# Patient Record
Sex: Female | Born: 1990 | Race: White | Hispanic: No | Marital: Single | Smoking: Never smoker
Health system: Southern US, Community
[De-identification: ages and names within clinical notes are randomized; demographics above are authoritative.]

## PROBLEM LIST (undated history)

## (undated) DIAGNOSIS — Q213 Tetralogy of Fallot: Secondary | ICD-10-CM

## (undated) HISTORY — PX: PULMONIC VALVE REPLACEMENT: SHX754

---
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
## 2014-11-10 ENCOUNTER — Emergency Department (HOSPITAL_BASED_OUTPATIENT_CLINIC_OR_DEPARTMENT_OTHER): Payer: Medicaid Other

## 2014-11-10 ENCOUNTER — Observation Stay (HOSPITAL_BASED_OUTPATIENT_CLINIC_OR_DEPARTMENT_OTHER)
Admission: EM | Admit: 2014-11-10 | Discharge: 2014-11-12 | Disposition: A | Discharge status: Home / Self Care | Payer: Medicaid Other | Attending: Cardiovascular Disease | Admitting: Cardiovascular Disease

## 2014-11-10 ENCOUNTER — Encounter (HOSPITAL_BASED_OUTPATIENT_CLINIC_OR_DEPARTMENT_OTHER): Payer: Self-pay | Admitting: Emergency Medicine

## 2014-11-10 DIAGNOSIS — Z955 Presence of coronary angioplasty implant and graft: Secondary | ICD-10-CM | POA: Diagnosis not present

## 2014-11-10 DIAGNOSIS — I372 Nonrheumatic pulmonary valve stenosis with insufficiency: Secondary | ICD-10-CM | POA: Insufficient documentation

## 2014-11-10 DIAGNOSIS — I4510 Unspecified right bundle-branch block: Secondary | ICD-10-CM | POA: Insufficient documentation

## 2014-11-10 DIAGNOSIS — R002 Palpitations: Secondary | ICD-10-CM | POA: Diagnosis present

## 2014-11-10 DIAGNOSIS — I493 Ventricular premature depolarization: Principal | ICD-10-CM | POA: Insufficient documentation

## 2014-11-10 DIAGNOSIS — I5020 Unspecified systolic (congestive) heart failure: Secondary | ICD-10-CM | POA: Diagnosis not present

## 2014-11-10 DIAGNOSIS — Z953 Presence of xenogenic heart valve: Secondary | ICD-10-CM | POA: Insufficient documentation

## 2014-11-10 DIAGNOSIS — I272 Other secondary pulmonary hypertension: Secondary | ICD-10-CM | POA: Diagnosis not present

## 2014-11-10 HISTORY — DX: Tetralogy of Fallot: Q21.3

## 2014-11-10 LAB — CBC WITH DIFFERENTIAL/PLATELET
Basophils Absolute: 0 10*3/uL (ref 0.0–0.1)
Basophils Relative: 0 % (ref 0–1)
EOS ABS: 0.1 10*3/uL (ref 0.0–0.7)
Eosinophils Relative: 1 % (ref 0–5)
HCT: 36.6 % (ref 36.0–46.0)
Hemoglobin: 12.3 g/dL (ref 12.0–15.0)
LYMPHS ABS: 1.7 10*3/uL (ref 0.7–4.0)
Lymphocytes Relative: 20 % (ref 12–46)
MCH: 26.6 pg (ref 26.0–34.0)
MCHC: 33.6 g/dL (ref 30.0–36.0)
MCV: 79 fL (ref 78.0–100.0)
Monocytes Absolute: 0.8 10*3/uL (ref 0.1–1.0)
Monocytes Relative: 10 % (ref 3–12)
NEUTROS ABS: 5.7 10*3/uL (ref 1.7–7.7)
Neutrophils Relative %: 69 % (ref 43–77)
Platelets: 281 10*3/uL (ref 150–400)
RBC: 4.63 MIL/uL (ref 3.87–5.11)
RDW: 17 % — AB (ref 11.5–15.5)
WBC: 8.3 10*3/uL (ref 4.0–10.5)

## 2014-11-10 LAB — CREATININE, SERUM
Creatinine, Ser: 0.99 mg/dL (ref 0.44–1.00)
GFR calc Af Amer: >60 mL/min (ref >60)
GFR calc non Af Amer: >60 mL/min (ref >60)

## 2014-11-10 LAB — BASIC METABOLIC PANEL
ANION GAP: 8 (ref 5–15)
BUN: 9 mg/dL (ref 6–20)
CALCIUM: 8.6 mg/dL — AB (ref 8.9–10.3)
CO2: 25 mmol/L (ref 22–32)
CREATININE: 0.91 mg/dL (ref 0.44–1.00)
Chloride: 104 mmol/L (ref 101–111)
GLUCOSE: 87 mg/dL (ref 65–99)
Potassium: 3.6 mmol/L (ref 3.5–5.1)
Sodium: 137 mmol/L (ref 135–145)

## 2014-11-10 LAB — CBC
HCT: 34.4 % — ABNORMAL LOW (ref 36.0–46.0)
HEMOGLOBIN: 11.7 g/dL — AB (ref 12.0–15.0)
MCH: 26.3 pg (ref 26.0–34.0)
MCHC: 34 g/dL (ref 30.0–36.0)
MCV: 77.3 fL — ABNORMAL LOW (ref 78.0–100.0)
Platelets: 281 10*3/uL (ref 150–400)
RBC: 4.45 MIL/uL (ref 3.87–5.11)
RDW: 16.9 % — ABNORMAL HIGH (ref 11.5–15.5)
WBC: 10.1 10*3/uL (ref 4.0–10.5)

## 2014-11-10 LAB — TROPONIN I: Troponin I: <0.03 ng/mL (ref <0.031)

## 2014-11-10 LAB — D-DIMER, QUANTITATIVE (NOT AT ARMC): D DIMER QUANT: 0.86 ug{FEU}/mL — AB (ref 0.00–0.48)

## 2014-11-10 LAB — BRAIN NATRIURETIC PEPTIDE: B NATRIURETIC PEPTIDE 5: 86.2 pg/mL (ref 0.0–100.0)

## 2014-11-10 MED ORDER — SODIUM CHLORIDE 0.9 % IV SOLN: 250.0000 mL | INTRAVENOUS | Status: DC | PRN

## 2014-11-10 MED ORDER — IOHEXOL 350 MG/ML SOLN
100.0000 mL | Freq: Once | INTRAVENOUS | Status: AC | PRN
  Administered 2014-11-10: 100 mL via INTRAVENOUS

## 2014-11-10 MED ORDER — ALUM & MAG HYDROXIDE-SIMETH 200-200-20 MG/5ML PO SUSP: 30.0000 mL | Freq: Four times a day (QID) | ORAL | Status: DC | PRN

## 2014-11-10 MED ORDER — SODIUM CHLORIDE 0.9 % IJ SOLN
3.0000 mL | Freq: Two times a day (BID) | INTRAMUSCULAR | Status: DC
  Administered 2014-11-10 – 2014-11-12 (×3): 3 mL via INTRAVENOUS

## 2014-11-10 MED ORDER — ACETAMINOPHEN 650 MG RE SUPP: 650.0000 mg | Freq: Four times a day (QID) | RECTAL | Status: DC | PRN

## 2014-11-10 MED ORDER — ONDANSETRON HCL 4 MG/2ML IJ SOLN: 4.0000 mg | Freq: Four times a day (QID) | INTRAMUSCULAR | Status: DC | PRN

## 2014-11-10 MED ORDER — ACETAMINOPHEN 325 MG PO TABS: 650.0000 mg | ORAL_TABLET | Freq: Four times a day (QID) | ORAL | Status: DC | PRN

## 2014-11-10 MED ORDER — SODIUM CHLORIDE 0.9 % IJ SOLN
3.0000 mL | INTRAMUSCULAR | Status: DC | PRN
  Administered 2014-11-11 (×2): 3 mL via INTRAVENOUS
  Filled 2014-11-10 (×2): qty 3

## 2014-11-10 MED ORDER — HEPARIN SODIUM (PORCINE) 5000 UNIT/ML IJ SOLN
5000.0000 [IU] | Freq: Three times a day (TID) | INTRAMUSCULAR | Status: DC
  Filled 2014-11-10 (×6): qty 1

## 2014-11-10 MED ORDER — ONDANSETRON HCL 4 MG PO TABS: 4.0000 mg | ORAL_TABLET | Freq: Four times a day (QID) | ORAL | Status: DC | PRN

## 2014-11-10 NOTE — ED Notes (Signed)
Pt in c/o palpitations and irregular beats. States open heart surgery in 2015 with valve replacements and stent placement. Pt in NAD at this time.

## 2014-11-10 NOTE — ED Notes (Signed)
On ambulation in hallway, pt sat dropped to 80%, heart rate remained 118 throughout. At periods of rest, sat rose to 90's, but fell back to 80% after reinitiating ambulation. Pt denied chest pain, SOB, dizziness, or lightheadedness throughout the walk.

## 2014-11-10 NOTE — ED Notes (Signed)
Denies recent illness and pt denies chest pain, fever, SOB or syncope.

## 2014-11-10 NOTE — ED Provider Notes (Signed)
CSN: [id number]     Arrival date & time 11/10/14  1132 History   First MD Initiated Contact with Patient 11/10/14 1150     Chief Complaint  Patient presents with  . Palpitations     (Consider location/radiation/quality/duration/timing/severity/associated sxs/prior Treatment) HPI Comments: Patient presents with palpitations. She has a history of tetralogy of flow. She had surgery for that when she was a child in [place]. She also had an aortic valve replacement with a bovine valve in 2015. This was done in [place]. She recently moved here to  [facility]. She doesn't currently have a cardiologist. She states over the last 3 days she's had some palpitations. She notices it more when she's getting up to ambulate. She denies any shortness of breath. She denies any chest pain or tightness. She denies any leg swelling. She denies any new medications. She denies any fevers chills or other recent illnesses. She denies any dizziness or lightheadedness. She denies any leg swelling or calf tenderness. She denies any recent travel history. She's a nonsmoker.  Patient is a 24 y.o. female presenting with palpitations.  Palpitations Associated symptoms: no back pain, no chest pain, no cough, no diaphoresis, no dizziness, no nausea, no numbness, no shortness of breath, no vomiting and no weakness     Past Medical History  Diagnosis Date  . Tetralogy of Fallot    Past Surgical History  Procedure Laterality Date  . Cardiac surgery    . Coronary stent placement    . Valve replacement     History reviewed. No pertinent family history. History  Substance Use Topics  . Smoking status: Never Smoker   . Smokeless tobacco: Not on file  . Alcohol Use: No   OB History    No data available     Review of Systems  Constitutional: Negative for fever, chills, diaphoresis and fatigue.  HENT: Negative for congestion, rhinorrhea and sneezing.   Eyes: Negative.   Respiratory: Negative for cough, chest  tightness and shortness of breath.   Cardiovascular: Positive for palpitations. Negative for chest pain and leg swelling.  Gastrointestinal: Negative for nausea, vomiting, abdominal pain, diarrhea and blood in stool.  Genitourinary: Negative for frequency, hematuria, flank pain and difficulty urinating.  Musculoskeletal: Negative for back pain and arthralgias.  Skin: Negative for rash.  Neurological: Negative for dizziness, speech difficulty, weakness, numbness and headaches.      Allergies  Review of patient's allergies indicates no known allergies.  Home Medications   Prior to Admission medications   Not on File   BP 122/85 mmHg  Pulse 85  Temp(Src) 98.3 F (36.8 C) (Oral)  Resp 18  Wt 140 lb (63.504 kg)  SpO2 100%  LMP 10/27/2014 Physical Exam  Constitutional: She is oriented to person, place, and time. She appears well-developed and well-nourished.  HENT:  Head: Normocephalic and atraumatic.  Eyes: Pupils are equal, round, and reactive to light.  Neck: Normal range of motion. Neck supple.  Cardiovascular: Normal rate and regular rhythm.   Murmur heard. Pulmonary/Chest: Effort normal and breath sounds normal. No respiratory distress. She has no wheezes. She has no rales. She exhibits no tenderness.  Abdominal: Soft. Bowel sounds are normal. There is no tenderness. There is no rebound and no guarding.  Musculoskeletal: Normal range of motion. She exhibits no edema.  No edema or calf tenderness  Lymphadenopathy:    She has no cervical adenopathy.  Neurological: She is alert and oriented to person, place, and time.  Skin: Skin is  warm and dry. No rash noted.  Psychiatric: She has a normal mood and affect.    ED Course  Procedures (including critical care time) Labs Review Results for orders placed or performed during the hospital encounter of 11/10/14  Basic metabolic panel  Result Value Ref Range   Sodium 137 135 - 145 mmol/L   Potassium 3.6 3.5 - 5.1 mmol/L    Chloride 104 101 - 111 mmol/L   CO2 25 22 - 32 mmol/L   Glucose, Bld 87 65 - 99 mg/dL   BUN 9 6 - 20 mg/dL   Creatinine, Ser 1.61 0.44 - 1.00 mg/dL   Calcium 8.6 (L) 8.9 - 10.3 mg/dL   GFR calc non Af Amer >60 >60 mL/min   GFR calc Af Amer >60 >60 mL/min   Anion gap 8 5 - 15  CBC with Differential  Result Value Ref Range   WBC 8.3 4.0 - 10.5 K/uL   RBC 4.63 3.87 - 5.11 MIL/uL   Hemoglobin 12.3 12.0 - 15.0 g/dL   HCT 09.6 04.5 - 40.9 %   MCV 79.0 78.0 - 100.0 fL   MCH 26.6 26.0 - 34.0 pg   MCHC 33.6 30.0 - 36.0 g/dL   RDW 81.1 (H) 91.4 - 78.2 %   Platelets 281 150 - 400 K/uL   Neutrophils Relative % 69 43 - 77 %   Neutro Abs 5.7 1.7 - 7.7 K/uL   Lymphocytes Relative 20 12 - 46 %   Lymphs Abs 1.7 0.7 - 4.0 K/uL   Monocytes Relative 10 3 - 12 %   Monocytes Absolute 0.8 0.1 - 1.0 K/uL   Eosinophils Relative 1 0 - 5 %   Eosinophils Absolute 0.1 0.0 - 0.7 K/uL   Basophils Relative 0 0 - 1 %   Basophils Absolute 0.0 0.0 - 0.1 K/uL  Troponin I  Result Value Ref Range   Troponin I <0.03 <0.031 ng/mL  Brain natriuretic peptide  Result Value Ref Range   B Natriuretic Peptide 86.2 0.0 - 100.0 pg/mL  D-dimer, quantitative  Result Value Ref Range   D-Dimer, Quant 0.86 (H) 0.00 - 0.48 ug/mL-FEU   Dg Chest 2 View  11/10/2014   CLINICAL DATA:  Patient with treated tetralogy of Fallot presenting with three-day history of heart palpitations and shortness of breath  EXAM: CHEST  2 VIEW  COMPARISON:  None.  FINDINGS: The patient has a prosthetic pulmonic valve as well as a stent between the main pulmonary outflow tract and aorta. The heart size is upper normal. The pulmonary vascularity is normal. The aortic arch is on the left side.  There is no lung edema or consolidation. No adenopathy. There is upper thoracic levoscoliosis.  IMPRESSION: Postoperative change with prosthetic pulmonic valve and stent between the main pulmonary outflow tract and aorta. No edema or consolidation. Heart upper  normal in size with pulmonary vascularity within normal limits.   Electronically Signed   By: Bretta Bang III M.D.   On: 11/10/2014 12:48      Imaging Review Dg Chest 2 View  11/10/2014   CLINICAL DATA:  Patient with treated tetralogy of Fallot presenting with three-day history of heart palpitations and shortness of breath  EXAM: CHEST  2 VIEW  COMPARISON:  None.  FINDINGS: The patient has a prosthetic pulmonic valve as well as a stent between the main pulmonary outflow tract and aorta. The heart size is upper normal. The pulmonary vascularity is normal. The aortic arch is on the  left side.  There is no lung edema or consolidation. No adenopathy. There is upper thoracic levoscoliosis.  IMPRESSION: Postoperative change with prosthetic pulmonic valve and stent between the main pulmonary outflow tract and aorta. No edema or consolidation. Heart upper normal in size with pulmonary vascularity within normal limits.   Electronically Signed   By: Bretta BangWilliam  Woodruff III M.D.   On: 11/10/2014 12:48     EKG Interpretation   Date/Time:  Thursday November 10 2014 11:44:07 EDT Ventricular Rate:  115 PR Interval:  142 QRS Duration: 138 QT Interval:  370 QTC Calculation: 511 R Axis:   -11 Text Interpretation:  Sinus tachycardia Right bundle branch block Abnormal  ECG No old tracing to compare Confirmed by Jakobi Thetford  MD, Ashlynne Shetterly ([id number]) on  11/10/2014 1:58:37 PM      MDM   Final diagnoses:  None    Pt with complicated cardiac hx of Tet of Fallot s/p repair and more recent Aortic valve replacement.  Pt has RBBB on EKG and murmur.  Suspect this is chronic for pt, but pt doesn't know about murmur and we were unable to obtain records from her hospitals, [facility] in [place].  The hospital states that they have no records for her other than an ED visit from 2011, including [facility].  PT states this is where she had her heart surgery and her wallet card supports this.    Pt is asymptomatic  other than palpitations.  No evidence of heart failure.  On ambulating, sats dropped to 80s and HR increased to 120s.  However patient was not short of breath. Given this, I did check a d-dimer which was elevated. Will check CT angio of her chest.  No evidence of PE.  Spoke with Dr. Algie CofferKadakia who [facility].  Rolan BuccoMelanie Brynnlie Unterreiner, MD 11/10/14 [phone]

## 2014-11-10 NOTE — H&P (Signed)
Referring Physician:  Kalii Chesmore is an 24 y.o. female.                       Chief Complaint: Palpitation  HPI: 24 year old female with Tetralogy of Fallot with prosthetic PV and stent in pulmonary artery when she was a child in [place], has palpitations x 3 days. She had hypoxemia with ambulation at Med center, [facility]. She denies any shortness of breath, chest pain or tightness or any leg swelling. She denies any new medications. She denies any fevers chills or other recent illnesses. She denies any dizziness or lightheadedness. She denies any leg swelling or calf tenderness. She denies any recent travel history. She's a nonsmoker.  Past Medical History  Diagnosis Date  . Tetralogy of Fallot       Past Surgical History  Procedure Laterality Date  . Cardiac surgery    . Coronary stent placement    . Valve replacement      History reviewed. No pertinent family history. Social History:  reports that she has never smoked. She does not have any smokeless tobacco history on file. She reports that she does not drink alcohol or use illicit drugs.  Allergies: No Known Allergies  No prescriptions prior to admission    Results for orders placed or performed during the hospital encounter of 11/10/14 (from the past 48 hour(s))  Brain natriuretic peptide     Status: None   Collection Time: 11/10/14 12:10 PM  Result Value Ref Range   B Natriuretic Peptide 86.2 0.0 - 100.0 pg/mL  Basic metabolic panel     Status: Abnormal   Collection Time: 11/10/14 12:15 PM  Result Value Ref Range   Sodium 137 135 - 145 mmol/L   Potassium 3.6 3.5 - 5.1 mmol/L   Chloride 104 101 - 111 mmol/L   CO2 25 22 - 32 mmol/L   Glucose, Bld 87 65 - 99 mg/dL   BUN 9 6 - 20 mg/dL   Creatinine, Ser 0.91 0.44 - 1.00 mg/dL   Calcium 8.6 (L) 8.9 - 10.3 mg/dL   GFR calc non Af Amer >60 >60 mL/min   GFR calc Af Amer >60 >60 mL/min    Comment: (NOTE) The eGFR has been calculated using the CKD EPI  equation. This calculation has not been validated in all clinical situations. eGFR's persistently <60 mL/min signify possible Chronic Kidney Disease.    Anion gap 8 5 - 15  CBC with Differential     Status: Abnormal   Collection Time: 11/10/14 12:15 PM  Result Value Ref Range   WBC 8.3 4.0 - 10.5 K/uL   RBC 4.63 3.87 - 5.11 MIL/uL   Hemoglobin 12.3 12.0 - 15.0 g/dL   HCT 36.6 36.0 - 46.0 %   MCV 79.0 78.0 - 100.0 fL   MCH 26.6 26.0 - 34.0 pg   MCHC 33.6 30.0 - 36.0 g/dL   RDW 17.0 (H) 11.5 - 15.5 %   Platelets 281 150 - 400 K/uL   Neutrophils Relative % 69 43 - 77 %   Neutro Abs 5.7 1.7 - 7.7 K/uL   Lymphocytes Relative 20 12 - 46 %   Lymphs Abs 1.7 0.7 - 4.0 K/uL   Monocytes Relative 10 3 - 12 %   Monocytes Absolute 0.8 0.1 - 1.0 K/uL   Eosinophils Relative 1 0 - 5 %   Eosinophils Absolute 0.1 0.0 - 0.7 K/uL   Basophils Relative 0 0 -  1 %   Basophils Absolute 0.0 0.0 - 0.1 K/uL  Troponin I     Status: None   Collection Time: 11/10/14 12:15 PM  Result Value Ref Range   Troponin I <0.03 <0.031 ng/mL    Comment:        NO INDICATION OF MYOCARDIAL INJURY.   D-dimer, quantitative     Status: Abnormal   Collection Time: 11/10/14 12:15 PM  Result Value Ref Range   D-Dimer, Quant 0.86 (H) 0.00 - 0.48 ug/mL-FEU    Comment:        AT THE INHOUSE ESTABLISHED CUTOFF VALUE OF 0.48 ug/mL FEU, THIS ASSAY HAS BEEN DOCUMENTED IN THE LITERATURE TO HAVE A SENSITIVITY AND NEGATIVE PREDICTIVE VALUE OF AT LEAST 98 TO 99%.  THE TEST RESULT SHOULD BE CORRELATED WITH AN ASSESSMENT OF THE CLINICAL PROBABILITY OF DVT / VTE.    Dg Chest 2 View  11/10/2014   CLINICAL DATA:  Patient with treated tetralogy of Fallot presenting with three-day history of heart palpitations and shortness of breath  EXAM: CHEST  2 VIEW  COMPARISON:  None.  FINDINGS: The patient has a prosthetic pulmonic valve as well as a stent between the main pulmonary outflow tract and aorta. The heart size is upper normal.  The pulmonary vascularity is normal. The aortic arch is on the left side.  There is no lung edema or consolidation. No adenopathy. There is upper thoracic levoscoliosis.  IMPRESSION: Postoperative change with prosthetic pulmonic valve and stent between the main pulmonary outflow tract and aorta. No edema or consolidation. Heart upper normal in size with pulmonary vascularity within normal limits.   Electronically Signed   By: Lowella Grip III M.D.   On: 11/10/2014 12:48   Ct Angio Chest Pe W/cm &/or Wo Cm  11/10/2014   CLINICAL DATA:  Shortness of breath.  EXAM: CT ANGIOGRAPHY CHEST WITH CONTRAST  TECHNIQUE: Multidetector CT imaging of the chest was performed using the standard protocol during bolus administration of intravenous contrast. Multiplanar CT image reconstructions and MIPs were obtained to evaluate the vascular anatomy.  CONTRAST:  167m OMNIPAQUE IOHEXOL 350 MG/ML SOLN  COMPARISON:  Chest x-ray dated 11/10/2014  FINDINGS: There is cardiomegaly. The patient has a prosthetic pulmonary valve and a stent in left main pulmonary artery. There is also slight prominence of the ascending thoracic aorta to a diameter of 3.2 cm.  There are no pulmonary emboli, infiltrates, or effusions. No acute osseous abnormality. Slight thoracic scoliosis. The visualized portion of the upper abdomen is normal.  Review of the MIP images confirms the above findings.  IMPRESSION: 1. No pulmonary emboli or other acute abnormalities. 2. There is what I suspect is chronic cardiomegaly with prosthetic mitral valve and left main pulmonary artery stent.   Electronically Signed   By: JLorriane ShireM.D.   On: 11/10/2014 15:21    Review Of Systems Constitutional: Negative for fever, chills, diaphoresis and fatigue.  HENT: Negative for congestion, rhinorrhea and sneezing.  Eyes: Negative.  Respiratory: Negative for cough, chest tightness and shortness of breath.  Cardiovascular: Positive for palpitations. Negative for  chest pain and leg swelling.  Gastrointestinal: Negative for nausea, vomiting, abdominal pain, diarrhea and blood in stool.  Genitourinary: Negative for frequency, hematuria, flank pain and difficulty urinating.  Musculoskeletal: Negative for back pain and arthralgias.  Skin: Negative for rash.  Neurological: Negative for dizziness, speech difficulty, weakness, numbness and headaches.  Blood pressure 110/80, pulse 111, temperature 98.3 F (36.8 C), temperature source Oral, resp.  rate 22, weight 63.504 kg (140 lb), last menstrual period 10/27/2014, SpO2 100 %. Physical Exam  Constitutional: She appears well-developed and well-nourished.  HENT: Normocephalic and atraumatic, Brown eyes, Pupils are equal, round, and reactive to light. EOMI. Conj-pale pink, Sclera-white. Neck: Normal range of motion. Neck supple.  Cardiovascular: Normal rate and regular rhythm.IV/VI systolic murmur heard with fixed splitting of S2. II/VI diastolic murmur. Pulmonary/Chest: Effort normal and breath sounds normal. No respiratory distress. She has no wheezes. She has no rales. She exhibits no tenderness.  Abdominal: Soft. Bowel sounds are normal. There is no tenderness. There is no rebound and no guarding.  Musculoskeletal: Normal range of motion. She exhibits no edema. No edema or calf tenderness. Lymphadenopathy: She has no cervical adenopathy.  Neurological: She is alert and oriented to person, place, and time. Moves all four extremities. Skin: Skin is warm and dry. No rash noted.  Psychiatric: She has a normal mood and affect.   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  Birdie Riddle,  MD  11/10/2014, 5:36 PM

## 2014-11-10 NOTE — ED Notes (Signed)
Patient transported to X-ray 

## 2014-11-11 ENCOUNTER — Observation Stay (HOSPITAL_COMMUNITY): Payer: Medicaid Other

## 2014-11-11 DIAGNOSIS — I493 Ventricular premature depolarization: Secondary | ICD-10-CM | POA: Diagnosis not present

## 2014-11-11 DIAGNOSIS — Z953 Presence of xenogenic heart valve: Secondary | ICD-10-CM | POA: Diagnosis not present

## 2014-11-11 DIAGNOSIS — I4510 Unspecified right bundle-branch block: Secondary | ICD-10-CM | POA: Diagnosis not present

## 2014-11-11 DIAGNOSIS — Z955 Presence of coronary angioplasty implant and graft: Secondary | ICD-10-CM | POA: Diagnosis not present

## 2014-11-11 LAB — BASIC METABOLIC PANEL
ANION GAP: 10 (ref 5–15)
BUN: 8 mg/dL (ref 6–20)
CHLORIDE: 105 mmol/L (ref 101–111)
CO2: 23 mmol/L (ref 22–32)
CREATININE: 0.94 mg/dL (ref 0.44–1.00)
Calcium: 8.7 mg/dL — ABNORMAL LOW (ref 8.9–10.3)
GFR calc Af Amer: >60 mL/min (ref >60)
GFR calc non Af Amer: >60 mL/min (ref >60)
GLUCOSE: 81 mg/dL (ref 65–99)
POTASSIUM: 4 mmol/L (ref 3.5–5.1)
Sodium: 138 mmol/L (ref 135–145)

## 2014-11-11 LAB — CBC
HCT: 35.6 % — ABNORMAL LOW (ref 36.0–46.0)
Hemoglobin: 11.9 g/dL — ABNORMAL LOW (ref 12.0–15.0)
MCH: 26 pg (ref 26.0–34.0)
MCHC: 33.4 g/dL (ref 30.0–36.0)
MCV: 77.9 fL — AB (ref 78.0–100.0)
Platelets: 276 10*3/uL (ref 150–400)
RBC: 4.57 MIL/uL (ref 3.87–5.11)
RDW: 17 % — ABNORMAL HIGH (ref 11.5–15.5)
WBC: 9.5 10*3/uL (ref 4.0–10.5)

## 2014-11-11 MED ORDER — METOPROLOL TARTRATE 25 MG PO TABS
25.0000 mg | ORAL_TABLET | Freq: Two times a day (BID) | ORAL | Status: DC
  Administered 2014-11-11: 25 mg via ORAL
  Filled 2014-11-11 (×3): qty 1

## 2014-11-11 NOTE — Progress Notes (Signed)
  Echocardiogram 2D Echocardiogram has been performed.  Brandi SavoyCasey N Massey Blake 11/11/2014, 10:20 AM

## 2014-11-11 NOTE — Progress Notes (Signed)
Pt. Was told that she could choose between the SCD and Heparin. Pt does not want heparin. Dorine Duffey 2:27 AM

## 2014-11-11 NOTE — Progress Notes (Signed)
Ref: No PCP Per Patient   Subjective:  Exertional dyspnea continues. Prosthetic PV is 26 years old with mild stenosis and RV dilatation.  Objective:  Vital Signs in the last 24 hours: Temp:  [98.1 F (36.7 C)-98.7 F (37.1 C)] 98.7 F (37.1 C) (07/01 0641) Pulse Rate:  [80-97] 87 (07/01 1543) Cardiac Rhythm:  [-] Normal sinus rhythm (07/01 0800) Resp:  [19-21] 20 (07/01 1543) BP: (97-115)/(57-62) 97/61 mmHg (07/01 1543) SpO2:  [98 %-100 %] 100 % (07/01 1543)  Physical Exam: BP Readings from Last 1 Encounters:  11/11/14 97/61     Wt Readings from Last 1 Encounters:  11/10/14 63.504 kg (140 lb)    Weight change:   HEENT: Advance/AT, Eyes-Brown, PERL, EOMI, Conjunctiva-Pink, Sclera-Non-icteric Neck: No JVD, No bruit, Trachea midline. Lungs:  Clear, Bilateral. Cardiac:  Regular rhythm, normal S1, no S3. IV/VI systolic murmur, II/VI diastolic murmur with fixed splitting of S2. Abdomen:  Soft, non-tender. Extremities:  No edema present. No cyanosis. No clubbing. CNS: AxOx3, Cranial nerves grossly intact, moves all 4 extremities. Right handed. Skin: Warm and dry.   Intake/Output from previous day:      Lab Results: BMET    Component Value Date/Time   NA 138 11/11/2014 0305   NA 137 11/10/2014 1215   K 4.0 11/11/2014 0305   K 3.6 11/10/2014 1215   CL 105 11/11/2014 0305   CL 104 11/10/2014 1215   CO2 23 11/11/2014 0305   CO2 25 11/10/2014 1215   GLUCOSE 81 11/11/2014 0305   GLUCOSE 87 11/10/2014 1215   BUN 8 11/11/2014 0305   BUN 9 11/10/2014 1215   CREATININE 0.94 11/11/2014 0305   CREATININE 0.99 11/10/2014 1842   CREATININE 0.91 11/10/2014 1215   CALCIUM 8.7* 11/11/2014 0305   CALCIUM 8.6* 11/10/2014 1215   GFRNONAA >60 11/11/2014 0305   GFRNONAA >60 11/10/2014 1842   GFRNONAA >60 11/10/2014 1215   GFRAA >60 11/11/2014 0305   GFRAA >60 11/10/2014 1842   GFRAA >60 11/10/2014 1215   CBC    Component Value Date/Time   WBC 9.5 11/11/2014 0305   RBC 4.57  11/11/2014 0305   HGB 11.9* 11/11/2014 0305   HCT 35.6* 11/11/2014 0305   PLT 276 11/11/2014 0305   MCV 77.9* 11/11/2014 0305   MCH 26.0 11/11/2014 0305   MCHC 33.4 11/11/2014 0305   RDW 17.0* 11/11/2014 0305   LYMPHSABS 1.7 11/10/2014 1215   MONOABS 0.8 11/10/2014 1215   EOSABS 0.1 11/10/2014 1215   BASOSABS 0.0 11/10/2014 1215   HEPATIC Function Panel No results for input(s): PROT in the last 8760 hours.  Invalid input(s):  ALBUMIN,  AST,  ALT,  ALKPHOS,  BILIDIR,  IBILI HEMOGLOBIN A1C No components found for: HGA1C,  MPG CARDIAC ENZYMES Lab Results  Component Value Date   TROPONINI <0.03 11/10/2014   BNP No results for input(s): PROBNP in the last 8760 hours. TSH No results for input(s): TSH in the last 8760 hours. CHOLESTEROL No results for input(s): CHOL in the last 8760 hours.  Scheduled Meds: . heparin  5,000 Units Subcutaneous 3 times per day  . metoprolol tartrate  25 mg Oral BID  . 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 May need oxygen use with activity. Refer to [facility] for further treatment Patient aware [facility]   LOS: 1 day    Orpah CobbAjay Viola Kinnick  MD  11/11/2014, 6:08 PM

## 2014-11-12 LAB — IRON AND TIBC
IRON: 60 ug/dL (ref 28–170)
SATURATION RATIOS: 13 % (ref 10.4–31.8)
TIBC: 449 ug/dL (ref 250–450)
UIBC: 389 ug/dL

## 2014-11-12 LAB — FERRITIN: Ferritin: 5 ng/mL — ABNORMAL LOW (ref 11–307)

## 2014-11-12 MED ORDER — METOPROLOL TARTRATE 25 MG PO TABS: 12.5000 mg | ORAL_TABLET | Freq: Two times a day (BID) | ORAL | Status: AC

## 2014-11-12 NOTE — Progress Notes (Addendum)
SATURATION QUALIFICATIONS: (This note is used to comply with regulatory documentation for home oxygen)  Patient Saturations on Room Air at Rest = 99  Patient Saturations on Room Air while Ambulating = 80% ( as noted on 11/11/14)  Patient Saturations on 2 Liters of oxygen while Ambulating = 94-99%  Please briefly explain why patient needs home oxygen: Patient will need oxygen to use at home as needed while ambulating.

## 2014-11-12 NOTE — Care Management Note (Addendum)
Case Management Note  Patient Details  Name: Safari Cinque MRN: [id number] Date of Birth: Sep 26, 1990  Subjective/Objective:                   Palpitation Action/Plan: Discharge plannning  Expected Discharge Date:  11/11/14               Expected Discharge Plan:  [place]  In-House Referral:     Discharge planning Services  CM Consult  Post Acute Care Choice:  Durable Medical Equipment, NA Choice offered to:  NA  DME Arranged:  Oxygen DME Agency:  [place]:    [facility] Agency:     Status of Service:  Completed, signed off  Medicare Important Message Given:    Date Medicare IM Given:    Medicare IM give by:    Date Additional Medicare IM Given:    Additional Medicare Important Message give by:     If discussed at [place] of Stay Meetings, dates discussed:    Additional Comments: 11:18 CM NOW notes both an appropriate DME order and saturation note placed and [facility] will make determination of charity home oxygen. CM received call from RN to please arrange for home O2. Pt has no insurance and will be charity.  CM placed request to [facility] rep, Tiffany who states there is no qualifying diagnosis. CM called Doylene Canard, MD who states her Tetralogy of Fallot qualifies her.  Cm has also requested of RN, Earlie Server, to [facility] write an appropriate pulmonary saturation note (without ranges) so [facility] can determine whether or not pt will qualify for O2. CM also requested an appropriate DME order which MD had not placed. CM met with pt with [facility] pamphlet and pt understands she will go to the clinic and ask for: AN APPT WITH A NAVIGATOR FOR INSURANCE; AN APPT TO SECURE A pcp; AN APPT FOR FOLLOW UP MEDICAL CARE.  NO other  CM needs were communicated. Dellie Catholic, RN 11/12/2014, 10:36 AM

## 2014-11-12 NOTE — Discharge Summary (Signed)
Physician Discharge Summary  Patient ID: Brandi Blake MRN: [id number] DOB/AGE: 1991/02/21 24 y.o.  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 She had hypoxemia with Oxygen saturation as low as 80% with ambulation at Med center, [facility] and here at [facility]. She denied any chest pain or tightness or any leg swelling. She denied any new medications. She denied any fevers chills or other recent illnesses. She denied any dizziness or lightheadedness. She denied any leg swelling or calf tenderness. She denied any recent travel history. She's a nonsmoker. Her shortness of breath and palpitations improved with Oxygen use and small dose of metoprolol. Her echocardiogram showed dilated RV with decreased systolic function and mild prosthetic PV stenosis and regurgitation (Probably moderate to severe if RV function was normal). She was advised to change her [place] Medicaid to [facility]. She has been referred to [facility] (facilitated by Dr. Jana Half) for further treatment. She will be followed by me in 1 week.  Consults:  cardiology  Significant Diagnostic Studies: labs: Normal CBC and BMET. Normal Troponin-I and BNP.  EKG-SR with right bundle branch block.  Echocardiogram: Left ventricle: The cavity size was normal. Systolic function was normal. The estimated ejection fraction was in the range of 50% to 55%. Wall motion was normal; there were no regional wall motion abnormalities. - Ventricular septum: A patchin the subaortic region was present. - Aortic valve: There was mild regurgitation. - Aortic root: The aortic root was mildly dilated. - Right ventricle: The cavity size was moderately dilated. Wall thickness was normal. Systolic function was mildly reduced. - Right atrium: The atrium was mildly dilated. - Pulmonic valve: A bioprosthesis was present. Transvalvular velocity was increased, due to stenosis. The findings are consistent with mild stenosis. There was mild regurgitation.  Treatments: cardiac meds: metoprolol and Oxygen by Nasal Cannula.  Discharge Exam: Blood pressure 88/52, pulse 71, temperature 98.5 F (36.9 C), temperature source Oral, resp. rate 20, weight 62.097 kg (136 lb 14.4 oz), last menstrual period 10/27/2014, SpO2 100 %. Physical Exam with nurse on bedside Constitutional: She appears well-developed and well-nourished.  HENT: Normocephalic and atraumatic, Brown eyes, Pupils are equal, round, and reactive to light. EOMI. Conj-pale pink, Sclera-white. Neck: Normal range of motion. Neck supple.  Cardiovascular: Normal rate and regular rhythm.IV/VI systolic murmur heard with fixed splitting of S2. II/VI diastolic murmur. Positive right ventricular heave on palpation. Pulmonary/Chest: Effort normal and breath sounds normal. No respiratory distress. She has no wheezes. She has no rales. She exhibits no tenderness.  Abdominal: Soft. Bowel sounds are normal. There is no tenderness.   Musculoskeletal: Normal range of motion. She exhibits no edema. No edema or calf  tenderness. Lymphadenopathy: She has no cervical adenopathy.  Neurological: She is alert and oriented to person, place, and time. Moves all four extremities. Skin: Skin  is warm and dry. No rash noted.  Psychiatric: She has a normal mood and affect.   Disposition: 01 Home, Self care.     Medication List    TAKE these medications        metoprolol tartrate 25 MG tablet  Commonly known as:  LOPRESSOR  Take 0.5 tablets (12.5 mg total) by mouth 2 (two) times daily.     norgestimate-ethinyl estradiol 0.25-35 MG-MCG tablet  Commonly known as:  ORTHO-CYCLEN,SPRINTEC,PREVIFEM  Take 1 tablet by mouth daily.           Follow-up Information    [facility]Merrel Crabbe S, MD. Schedule an appointment as soon as possible for a visit in 1 week.   Specialty:  Cardiology   Contact information:   [address] [place] [address] [phone]       Signed: Ricki RodriguezKADAKIA,Torrence Branagan S 11/12/2014, 11:15 AM

## 2014-11-12 NOTE — Progress Notes (Signed)
Noted orders to DC patient home.Discharge instructions given and patient verbalized understanding.DME provided per order.

## 2014-11-15 ENCOUNTER — Encounter: Payer: Self-pay | Admitting: Family Medicine

## 2014-11-15 ENCOUNTER — Ambulatory Visit: Payer: Self-pay | Attending: Family Medicine | Admitting: Family Medicine

## 2014-11-15 VITALS — BP 93/69 | HR 75 | Temp 98.3°F | Resp 18 | Ht 63.0 in | Wt 140.8 lb

## 2014-11-15 DIAGNOSIS — E559 Vitamin D deficiency, unspecified: Secondary | ICD-10-CM

## 2014-11-15 DIAGNOSIS — R002 Palpitations: Secondary | ICD-10-CM

## 2014-11-15 DIAGNOSIS — Z114 Encounter for screening for human immunodeficiency virus [HIV]: Secondary | ICD-10-CM

## 2014-11-15 DIAGNOSIS — Q213 Tetralogy of Fallot: Secondary | ICD-10-CM | POA: Insufficient documentation

## 2014-11-15 LAB — TSH: TSH: 2.393 u[IU]/mL (ref 0.350–4.500)

## 2014-11-15 LAB — HIV ANTIBODY (ROUTINE TESTING W REFLEX): HIV: NONREACTIVE

## 2014-11-15 NOTE — Progress Notes (Signed)
   Subjective:    Patient ID: Brandi Blake, female    DOB: Aug 14, 1990, 24 y.o.   MRN: [id number] ED f/u palpitations  HPI 24 yo F with hx of Tetralogy of Fallot with prosthetic PV and stent in pulmonary artery when [place] a [place]  1. ED f/u palpitations: stated on 11/07/2013 while at rest at home associated with SOB. No associated CP, dizziness, lightheadedness, sweating. Patient went to ED on 11/09/13. Her heart rate at the time was 115.  She had negative CT chest during her hospitalization. She had mid pulmonic valve stenosis noted on 2D-ECHO. She has started on metoprolol 12.4 mg BID and 2L O2 via nasal cannular for low O2 down to 80% on RA with ambulation, [facility] 90[facility].   Med Hx: Tetralogy of Fallot  Surg Hx: Pulmonic valve replacement  Soc Hx: non smoker   Review of Systems  Constitutional: Negative for fever, chills, diaphoresis and fatigue.  Eyes: Negative for visual disturbance.  Respiratory: Negative for cough, chest tightness and shortness of breath.   Cardiovascular: Negative for chest pain, palpitations and leg swelling.  Skin: Negative for pallor.  Neurological: Negative for dizziness and light-headedness.  Psychiatric/Behavioral: Negative for suicidal ideas and dysphoric mood.      Objective:   Physical Exam BP 93/69 mmHg  Pulse 75  Temp(Src) 98.3 F (36.8 C) (Oral)  Resp 18  Ht 5\' 3"  (1.6 m)  Wt 140 lb 12.8 oz (63.866 kg)  BMI 24.95 kg/m2  SpO2 99%  LMP 10/27/2014 General appearance: alert, cooperative and no distress Lungs: clear to auscultation bilaterally Heart: normal rate and rhythm systolic murmur with fixed splitting of S2 Extremities: extremities normal, atraumatic, no cyanosis or edema      Assessment & Plan:

## 2014-11-15 NOTE — Patient Instructions (Signed)
Brandi Blake,  Thank you for coming in today. It was a pleasure meeting you. I look forward to being your primary doctor.   1. PalpitaCloretta Nedtions:  Improved with beta block and supplemental O2 Continue current regimen for now, your oxygen levels are doing well although on the low end of normal without extra oxygen. Checking vitamin D level and thyroid test to rule out other common causes of palpitations   Make and keep hospital follow up with Dr. Algie CofferKadakia as it is important to have your valves and stent monitored by a cardiologist at least yearly.  [name]. Schedule an appointment as soon as possible for a visit in 1 week.    Specialty: Cardiology   Contact information:   [address] [place] [address] [phone]          2. Healthcare maintenance: You are due for screening HIV, one time test for every adult, drawn today  Also due for pap smear  F/u in 3-4 weeks for pap smear  Dr. [name]

## 2014-11-15 NOTE — Assessment & Plan Note (Addendum)
Healthcare maintenance: You are due for screening HIV, one time test for every adult, drawn today  Also due for pap smear  Screening HIV normal

## 2014-11-15 NOTE — Progress Notes (Signed)
Patient here for hospital follow up for palpitations.  Patient states she "feels good" today.  Patient denies chest pain, pressure, tightness, palpitations, dizziness, lightheadedness, and fatigue at this time.  Patient states she becomes short of breath when walking around, but 2L oxygen helps.  Patient BP: 93/69, HR: 75, O2: 99%.  Patient has no further complaints at this time.   O2 Testing:  Room air resting= 98% Room air with exertion= 94% 2 Lpm with exertion= 95%

## 2014-11-15 NOTE — Assessment & Plan Note (Signed)
Palpitations:  Improved with beta block and supplemental O2 Continue current regimen for now, your oxygen levels are doing well although on the low end of normal without extra oxygen. Checking vitamin D level and thyroid test to rule out other common causes of palpitations   Make and keep hospital follow up with Dr. Algie CofferKadakia as it is important to have your valves and stent monitored by a cardiologist at least yearly.  [name]. Schedule an appointment as soon as possible for a visit in 1 week.    Specialty: Cardiology   Contact information:   [address] [place] [address] [phone]

## 2014-11-16 ENCOUNTER — Telehealth: Payer: Self-pay | Admitting: *Deleted

## 2014-11-16 DIAGNOSIS — E559 Vitamin D deficiency, unspecified: Secondary | ICD-10-CM | POA: Insufficient documentation

## 2014-11-16 LAB — VITAMIN D 25 HYDROXY (VIT D DEFICIENCY, FRACTURES): Vit D, 25-Hydroxy: 25 ng/mL — ABNORMAL LOW (ref 30–100)

## 2014-11-16 MED ORDER — VITAMIN D3 50 MCG (2000 UT) PO TABS: 2000.0000 [IU] | ORAL_TABLET | Freq: Every day | ORAL | Status: AC

## 2014-11-16 NOTE — Assessment & Plan Note (Signed)
Vit D insufficiency.  Vit D3 2 K U daily

## 2014-11-16 NOTE — Telephone Encounter (Signed)
Unable to contact Pt  "Voice mail is full" 

## 2014-11-16 NOTE — Addendum Note (Signed)
Addended by: Dessa PhiFUNCHES, Arian Mcquitty on: 11/16/2014 09:35 AM   Modules accepted: Orders

## 2014-11-16 NOTE — Telephone Encounter (Signed)
-----   Message from Dessa PhiJosalyn Funches, MD sent at 11/16/2014  9:34 AM EDT ----- Vit D insuff take 2 K U of D3 daily  TSH normal Screening HIV negative

## 2016-07-01 IMAGING — DX DG CHEST 2V
2 series · 2 of 2 positions shown · non-contrast
Comparison: None.

CLINICAL DATA: Patient with treated tetralogy of Telesforo presenting
with three-day history of heart palpitations and shortness of breath

EXAM:
CHEST  2 VIEW

[chest pa]
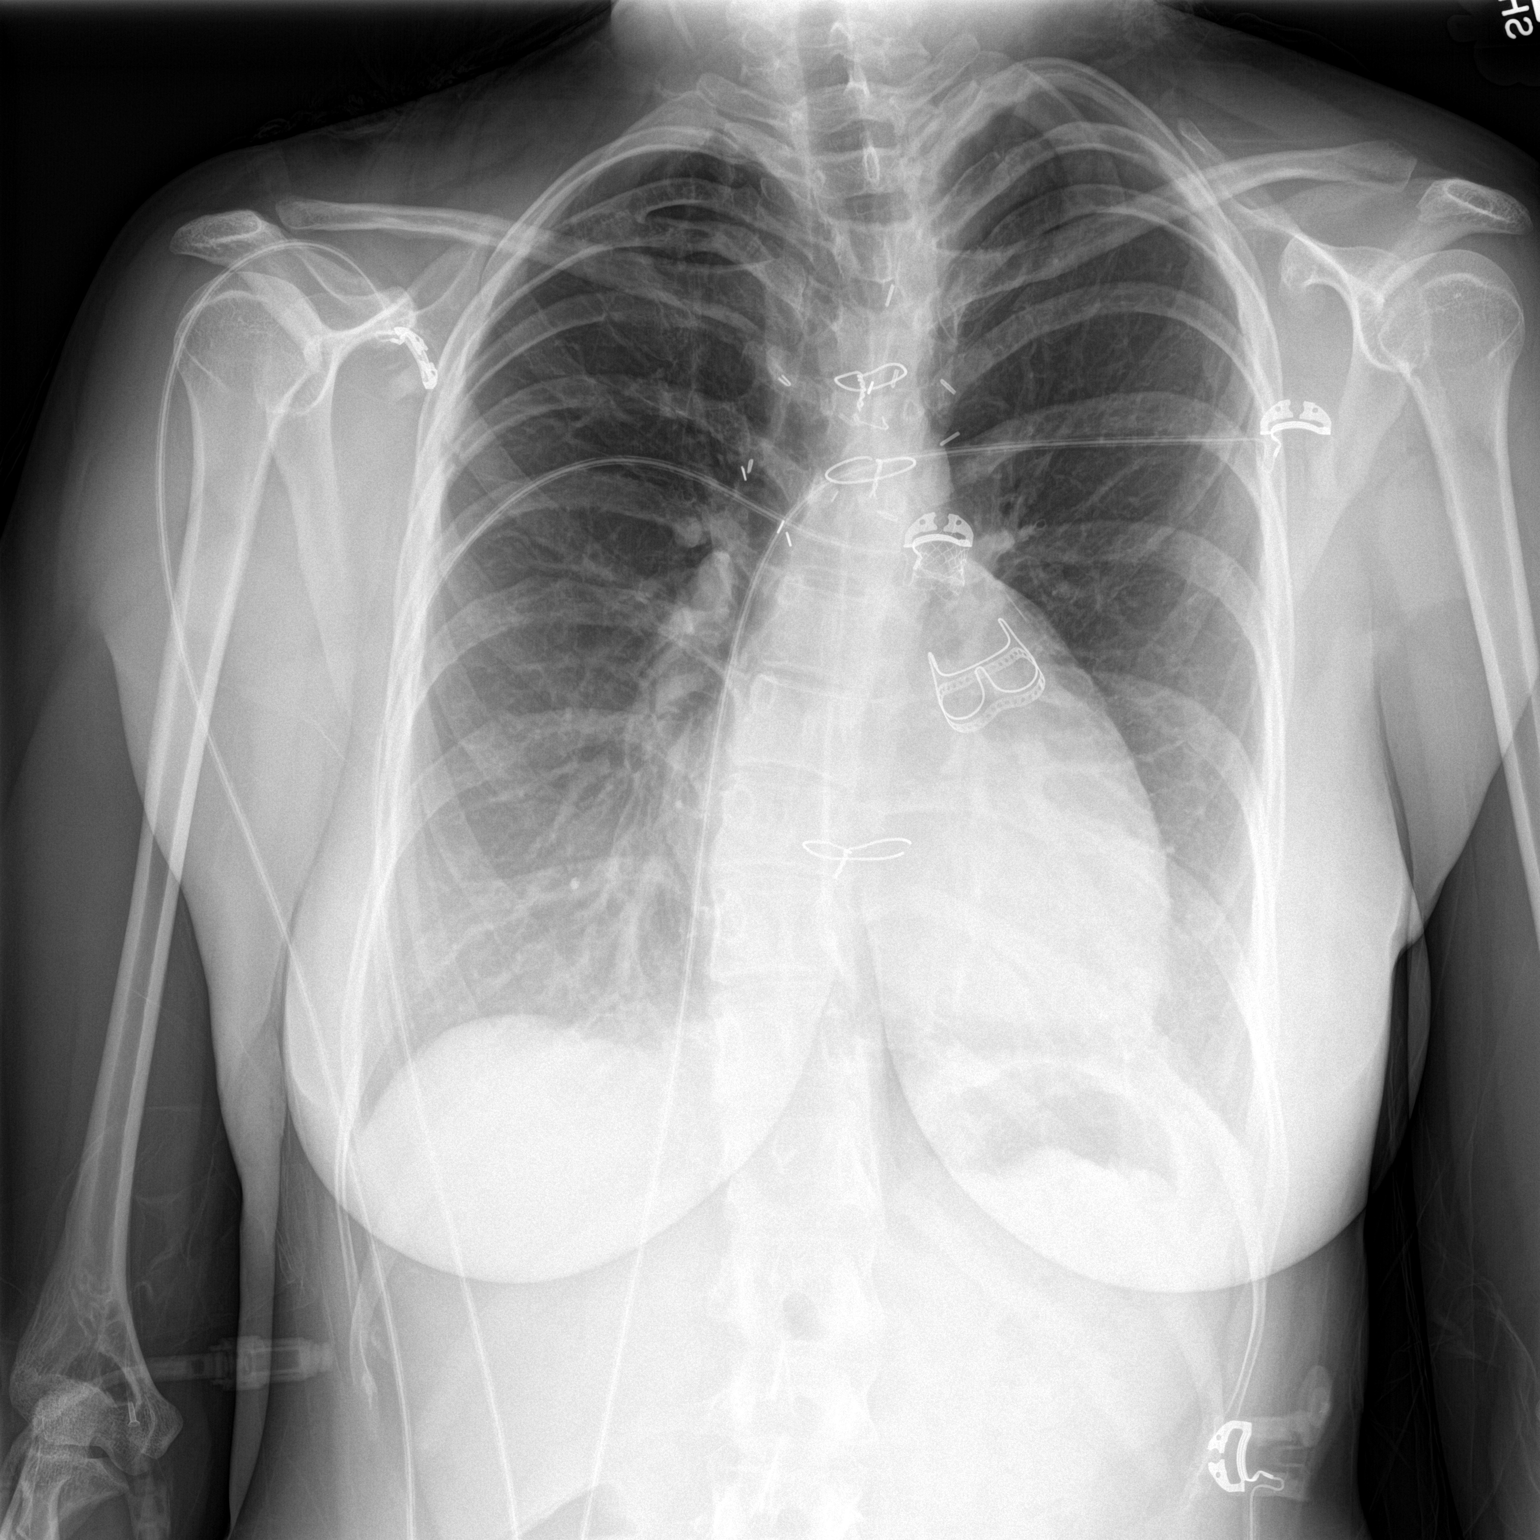

[chest lat]
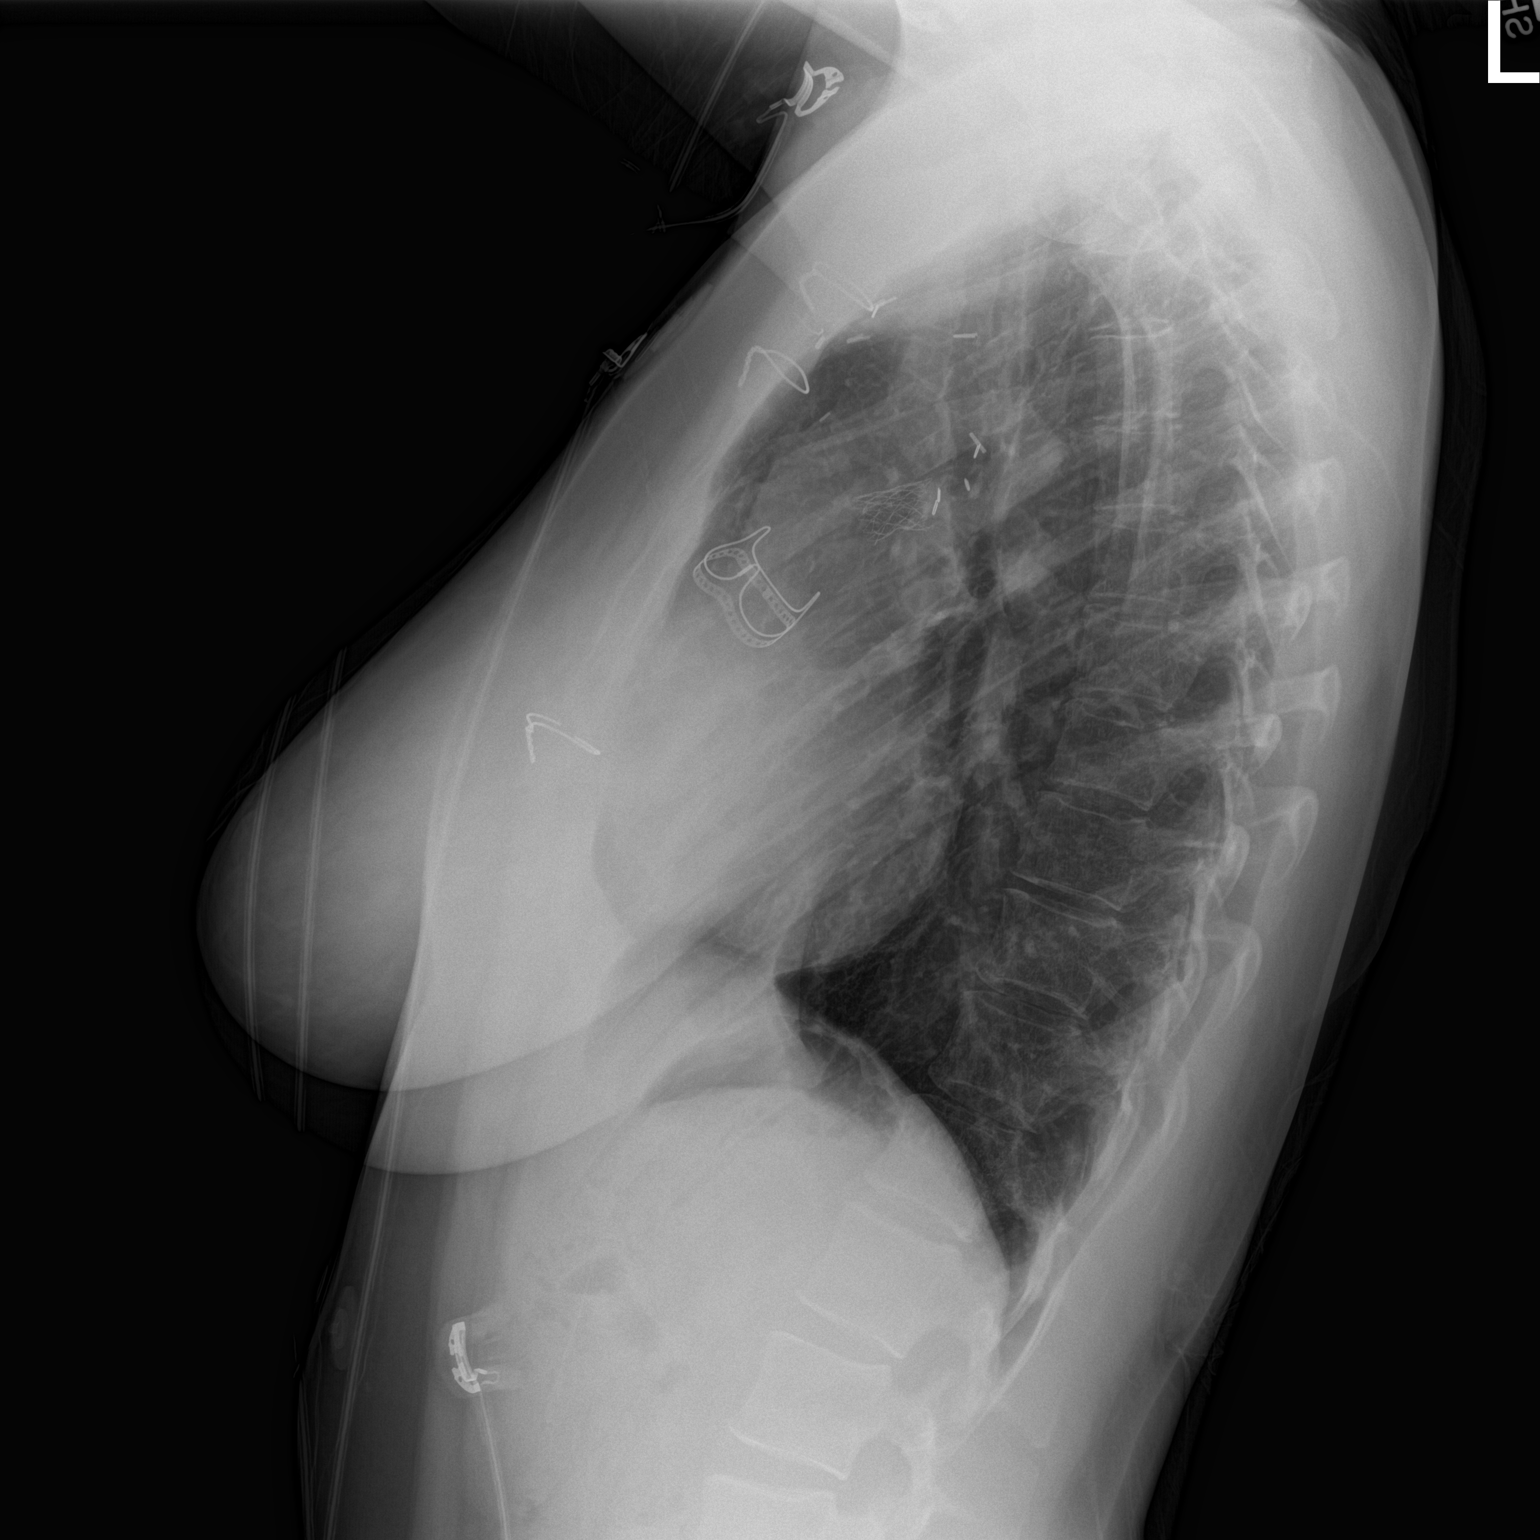

[2 of 2 positions shown; findings below may reference images not displayed]

FINDINGS: The patient has a prosthetic pulmonic valve as well as a stent
between the main pulmonary outflow tract and aorta. The heart size
is upper normal. The pulmonary vascularity is normal. The aortic
arch is on the left side.

There is no lung edema or consolidation. No adenopathy. There is
upper thoracic levoscoliosis.
IMPRESSION: Postoperative change with prosthetic pulmonic valve and stent
between the main pulmonary outflow tract and aorta. No edema or
consolidation. Heart upper normal in size with pulmonary vascularity
within normal limits.

## 2016-07-01 IMAGING — CT CT ANGIO CHEST
2 of 6 series · 19 of 36 positions shown · IV contrast (omnipaque)
Comparison: Chest x-ray dated 11/10/2014

CLINICAL DATA: Shortness of breath.

EXAM:
CT ANGIOGRAPHY CHEST WITH CONTRAST
TECHNIQUE: Multidetector CT imaging of the chest was performed using the
standard protocol during bolus administration of intravenous
contrast. Multiplanar CT image reconstructions and MIPs were
obtained to evaluate the vascular anatomy.
CONTRAST:  100mL OMNIPAQUE IOHEXOL 350 MG/ML SOLN

[Series 7: pe 1.0 b26f · axial · 0.54mm/px · z∈[+1002,+1249]mm · 18 of 275 slices shown]
[im 14/275  lung]
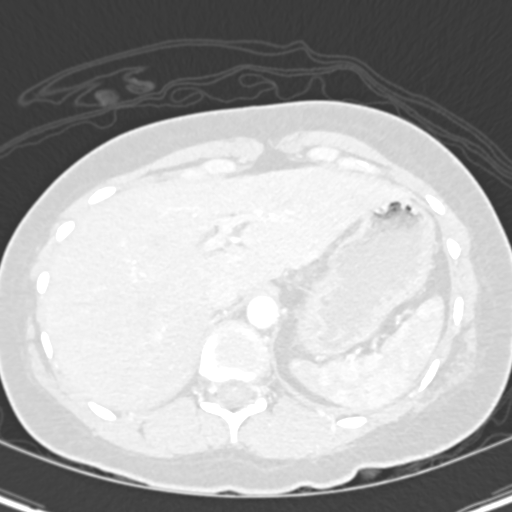
[im 28/275  mediastinal]
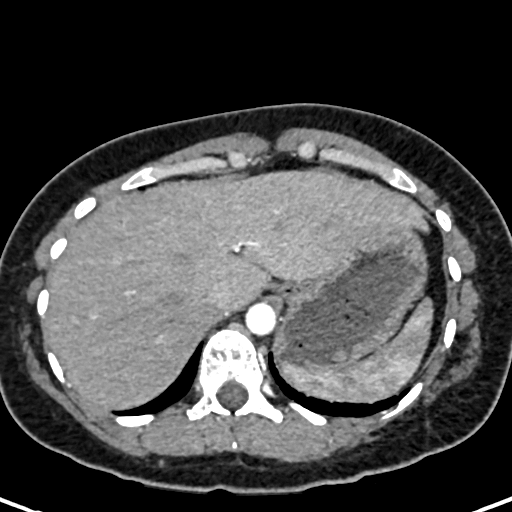
[im 42/275  lung]
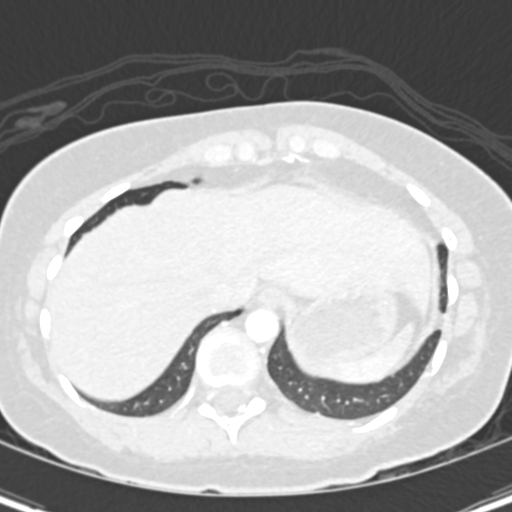
[im 55/275  mediastinal]
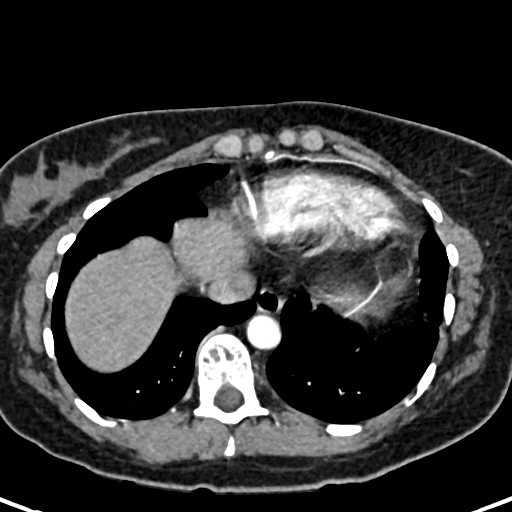
[im 69/275  lung]
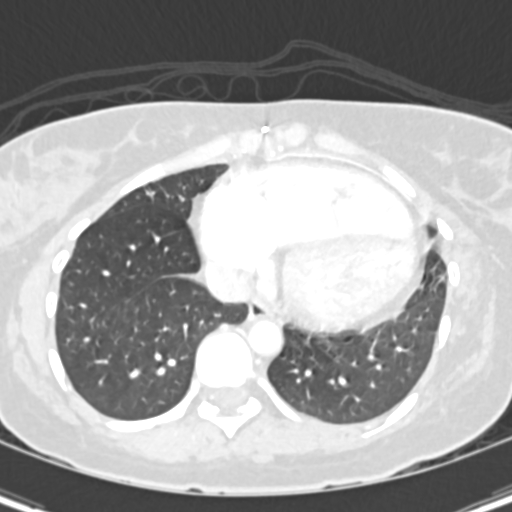
[im 83/275  mediastinal]
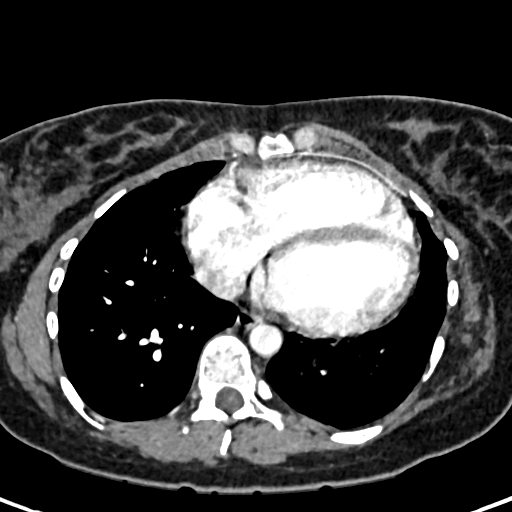
[im 96/275  lung]
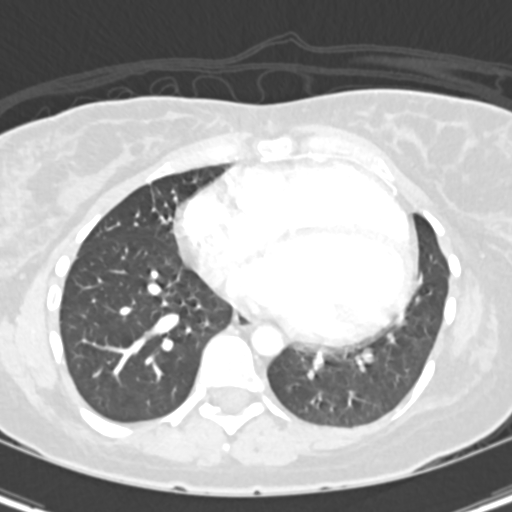
[im 110/275  mediastinal]
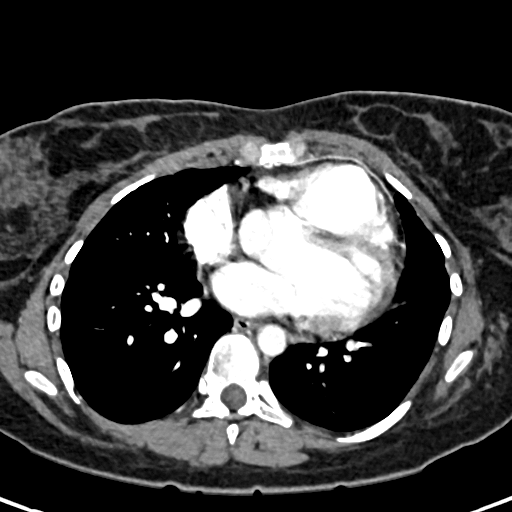
[im 124/275  lung]
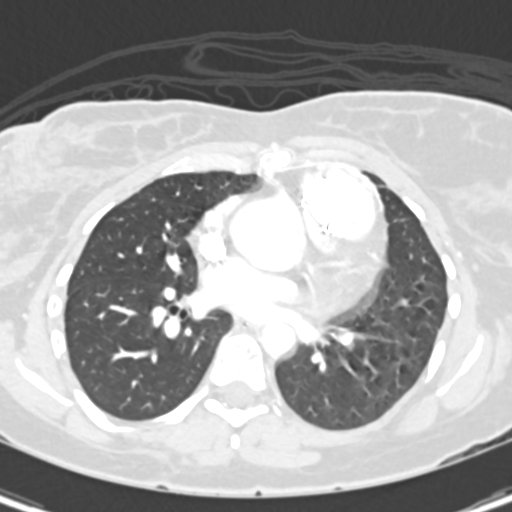
[im 151/275  mediastinal]
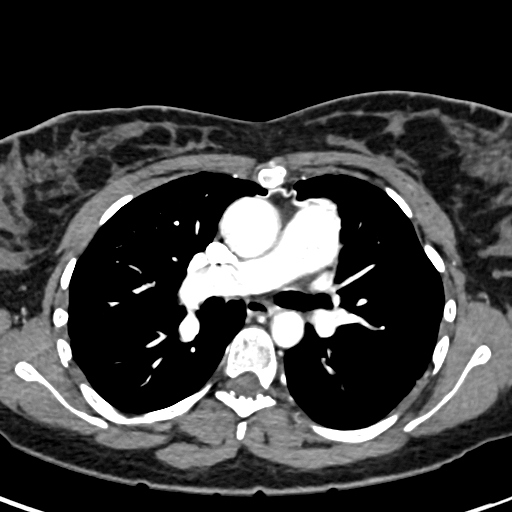
[im 165/275  lung]
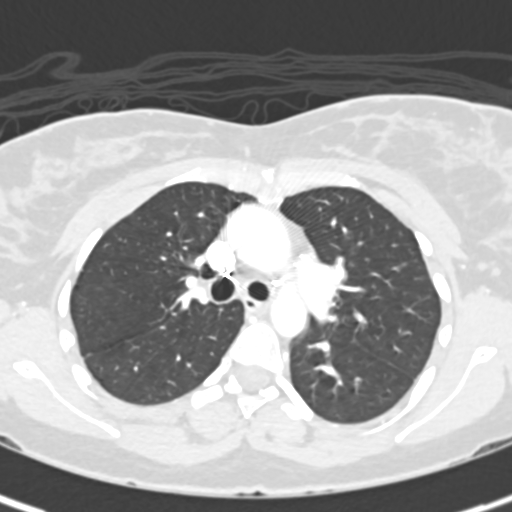
[im 179/275  mediastinal]
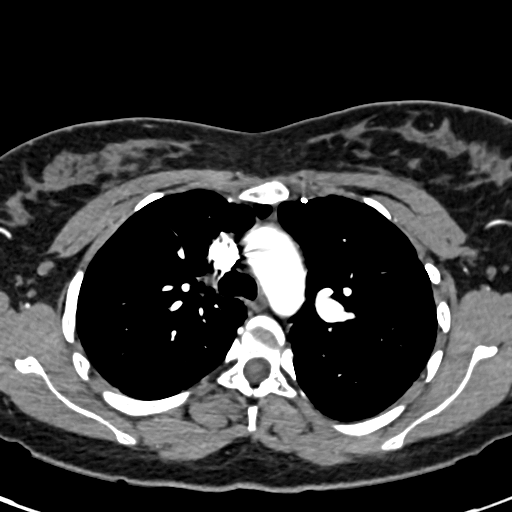
[im 192/275  lung]
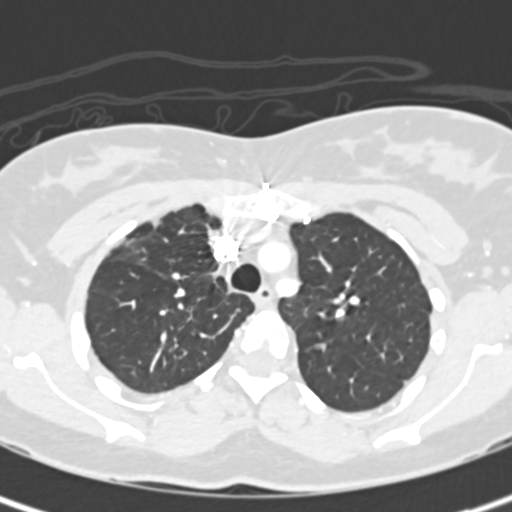
[im 206/275  mediastinal]
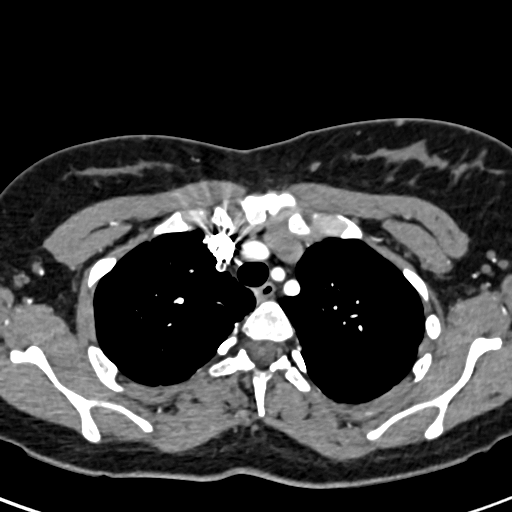
[im 220/275  lung]
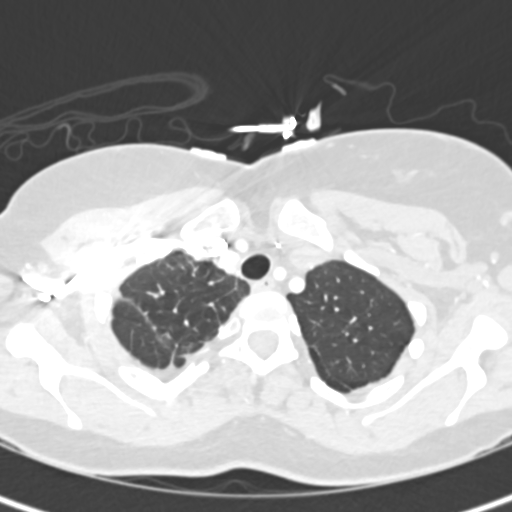
[im 233/275  mediastinal]
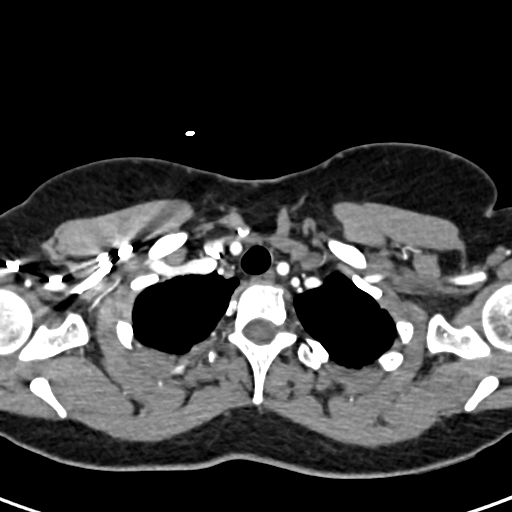
[im 247/275  lung]
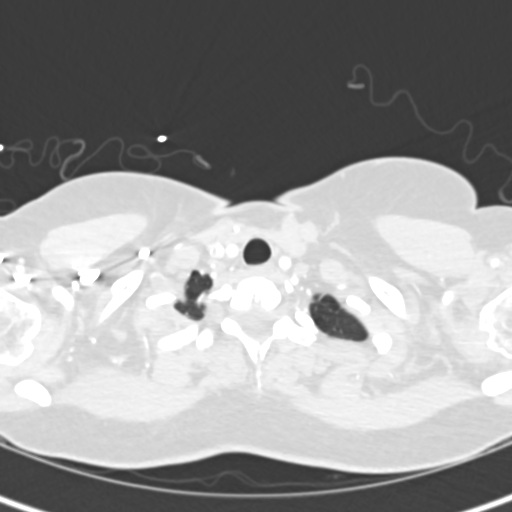
[im 261/275  mediastinal]
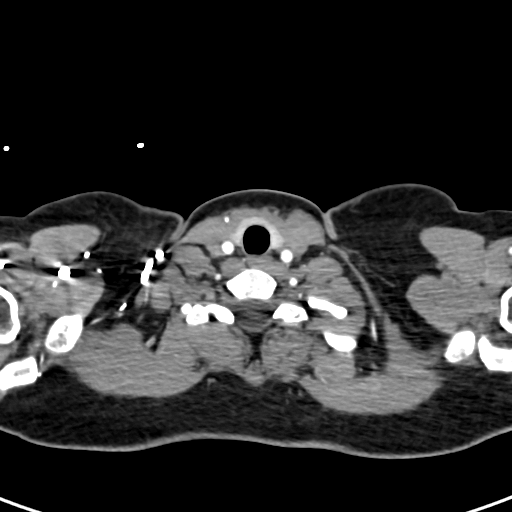

[Series 10: pe 2.0 coronal · coronal · 0.60mm/px · 1 of 109 slices shown]
[im 55/109  mediastinal]
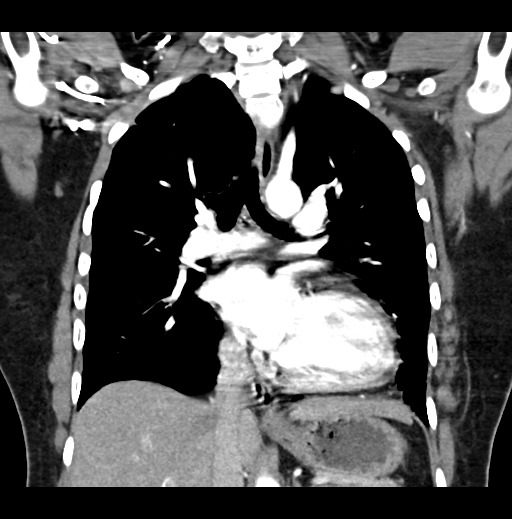

[19 of 36 positions shown; findings below may reference images not displayed]

FINDINGS: There is cardiomegaly. The patient has a prosthetic pulmonary valve
and a stent in left main pulmonary artery. There is also slight
prominence of the ascending thoracic aorta to a diameter of 3.2 cm.

There are no pulmonary emboli, infiltrates, or effusions. No acute
osseous abnormality. Slight thoracic scoliosis. The visualized
portion of the upper abdomen is normal.

Review of the MIP images confirms the above findings.
IMPRESSION: 1. No pulmonary emboli or other acute abnormalities.
2. There is what I suspect is chronic cardiomegaly with prosthetic
mitral valve and left main pulmonary artery stent.

## 2017-11-10 ENCOUNTER — Emergency Department (HOSPITAL_BASED_OUTPATIENT_CLINIC_OR_DEPARTMENT_OTHER)
Admission: EM | Admit: 2017-11-10 | Discharge: 2017-11-10 | Disposition: A | Discharge status: Home / Self Care | Payer: Medicaid Other | Attending: Emergency Medicine | Admitting: Emergency Medicine

## 2017-11-10 ENCOUNTER — Emergency Department (HOSPITAL_BASED_OUTPATIENT_CLINIC_OR_DEPARTMENT_OTHER): Payer: Medicaid Other

## 2017-11-10 ENCOUNTER — Other Ambulatory Visit: Payer: Self-pay

## 2017-11-10 ENCOUNTER — Encounter (HOSPITAL_BASED_OUTPATIENT_CLINIC_OR_DEPARTMENT_OTHER): Payer: Self-pay | Admitting: *Deleted

## 2017-11-10 DIAGNOSIS — R002 Palpitations: Secondary | ICD-10-CM | POA: Diagnosis not present

## 2017-11-10 DIAGNOSIS — Z79899 Other long term (current) drug therapy: Secondary | ICD-10-CM | POA: Insufficient documentation

## 2017-11-10 LAB — CBC WITH DIFFERENTIAL/PLATELET
BASOS PCT: 0 %
Basophils Absolute: 0 10*3/uL (ref 0.0–0.1)
Eosinophils Absolute: 0 10*3/uL (ref 0.0–0.7)
Eosinophils Relative: 0 %
HEMATOCRIT: 40.4 % (ref 36.0–46.0)
HEMOGLOBIN: 14.6 g/dL (ref 12.0–15.0)
LYMPHS ABS: 1.5 10*3/uL (ref 0.7–4.0)
Lymphocytes Relative: 20 %
MCH: 32.1 pg (ref 26.0–34.0)
MCHC: 36.1 g/dL — AB (ref 30.0–36.0)
MCV: 88.8 fL (ref 78.0–100.0)
MONO ABS: 0.8 10*3/uL (ref 0.1–1.0)
MONOS PCT: 10 %
NEUTROS ABS: 5 10*3/uL (ref 1.7–7.7)
Neutrophils Relative %: 70 %
Platelets: 257 10*3/uL (ref 150–400)
RBC: 4.55 MIL/uL (ref 3.87–5.11)
RDW: 12.4 % (ref 11.5–15.5)
WBC: 7.3 10*3/uL (ref 4.0–10.5)

## 2017-11-10 LAB — RAPID URINE DRUG SCREEN, HOSP PERFORMED
AMPHETAMINES: NOT DETECTED
Benzodiazepines: NOT DETECTED
Cocaine: NOT DETECTED
OPIATES: NOT DETECTED
TETRAHYDROCANNABINOL: NOT DETECTED

## 2017-11-10 LAB — BASIC METABOLIC PANEL
Anion gap: 8 (ref 5–15)
BUN: 13 mg/dL (ref 6–20)
CHLORIDE: 103 mmol/L (ref 98–111)
CO2: 27 mmol/L (ref 22–32)
Calcium: 9.1 mg/dL (ref 8.9–10.3)
Creatinine, Ser: 0.85 mg/dL (ref 0.44–1.00)
GFR calc Af Amer: >60 mL/min (ref >60)
GFR calc non Af Amer: >60 mL/min (ref >60)
GLUCOSE: 84 mg/dL (ref 70–99)
Potassium: 3.6 mmol/L (ref 3.5–5.1)
Sodium: 138 mmol/L (ref 135–145)

## 2017-11-10 LAB — PREGNANCY, URINE: PREG TEST UR: NEGATIVE

## 2017-11-10 LAB — TROPONIN I: Troponin I: <0.03 ng/mL (ref <0.03)

## 2017-11-10 MED ORDER — ALPRAZOLAM 0.25 MG PO TABS: 0.2500 mg | ORAL_TABLET | Freq: Every evening | ORAL | 0 refills | Status: AC | PRN

## 2017-11-10 MED ORDER — METOPROLOL SUCCINATE ER 25 MG PO TB24: 12.5000 mg | ORAL_TABLET | Freq: Two times a day (BID) | ORAL | 0 refills | Status: AC

## 2017-11-10 MED ORDER — SODIUM CHLORIDE 0.9 % IV BOLUS
500.0000 mL | Freq: Once | INTRAVENOUS | Status: AC
  Administered 2017-11-10: 500 mL via INTRAVENOUS

## 2017-11-10 NOTE — ED Triage Notes (Signed)
2 days she has had heart palpitations. Hx of same throughout her life. She has taken Xanax and Metoprolol in the past. She has not been treated in 2 years.

## 2017-11-10 NOTE — ED Notes (Signed)
Family at bedside. 

## 2017-11-10 NOTE — ED Notes (Signed)
Patient is resting comfortably. 

## 2017-11-10 NOTE — ED Notes (Signed)
ED Provider at bedside. 

## 2017-11-10 NOTE — ED Notes (Signed)
Called [facility] to follow up regarding consult - was advised that they were slammed and we would need to be patient for call back

## 2017-11-10 NOTE — ED Provider Notes (Signed)
[facility] EMERGENCY DEPARTMENT Provider Note   CSN: [id number] Arrival date & time: 11/10/17  1222     History   Chief Complaint Chief Complaint  Patient presents with  . Palpitations    HPI Brandi Blake is a 27 y.o. female past medical history of tetralogy of fellow who presents for evaluation of palpitations that have been intermittently ongoing for last week or so.  Patient reports that for the last week, she has had episodes of palpitations.  She states when she has these, she gets a sharp pain to her chest.  Patient reports that sometimes she gets dizzy and nauseous with the symptoms.  She states that the palpitations are not induced by any exertional activity.  She states that she has had a while at rest and while sleeping.  She states that they mostly happen at night.  Patient reports that they resolve on their own but take a few minutes.  Patient reports that while she has these episodes of palpitations, she also has some shortness of breath.  Patient today reports that she feels like she is having "2 heartbeats."  States she feels like a fluttering sensation in the left upper abdomen.  Patient reports that she has has recently been within her normal state of health.  She recently had a cardiologist appointment that was unremarkable.  Patient reports that she does have a history of tetralogy of flow that required a surgery as an infant and then a repeat surgery at age 32 where she had an aortic valve replacement.  Patient denies any fevers, abdominal pain, vomiting.  denies any OCP use, recent immobilization, prior history of DVT/PE, recent surgery, leg swelling, or long travel.  She states she is not a current smoker.  She denies any cocaine use.  She denies any increased caffeine intake.  Cardiology: Delrae Sawyers (Cardiology)    The history is provided by the patient.    Past Medical History:  Diagnosis Date  . Tetralogy of Fallot     Patient Active Problem  List   Diagnosis Date Noted  . Vitamin D insufficiency 11/16/2014  . Tetralogy of Fallot 11/15/2014  . Screening for HIV (human immunodeficiency virus) 11/15/2014  . Palpitation 11/10/2014    Past Surgical History:  Procedure Laterality Date  . CORONARY ANGIOPLASTY WITH STENT PLACEMENT  2005  . PULMONIC VALVE REPLACEMENT  Oct 14, 1988   Tetralogy of Fallot with prosthetic PV and stent in pulmonary artery/notes 11/10/2014     OB History   None      Home Medications    Prior to Admission medications   Medication Sig Start Date End Date Taking? Authorizing Provider  ALPRAZolam (XANAX) 0.25 MG tablet Take 1 tablet (0.25 mg total) by mouth at bedtime as needed for anxiety. 11/10/17   Maxwell Caul, PA-C  Cholecalciferol (VITAMIN D3) 2000 UNITS TABS Take 2,000 Units by mouth daily. 11/16/14   Funches, Gerilyn Nestle, MD  metoprolol succinate (TOPROL-XL) 25 MG 24 hr tablet Take 0.5 tablets (12.5 mg total) by mouth 2 (two) times daily for 7 days. 11/10/17 11/17/17  Graciella Freer A, PA-C  metoprolol tartrate (LOPRESSOR) 25 MG tablet Take 0.5 tablets (12.5 mg total) by mouth 2 (two) times daily. 11/12/14   Orpah Cobb, MD  norgestimate-ethinyl estradiol (ORTHO-CYCLEN,SPRINTEC,PREVIFEM) 0.25-35 MG-MCG tablet Take 1 tablet by mouth daily.    [provider]    Family History No family history on file.  Social History Social History   Tobacco Use  . Smoking status:  Never Smoker  . Smokeless tobacco: Never Used  Substance Use Topics  . Alcohol use: No  . Drug use: No     Allergies   Patient has no known allergies.   Review of Systems Review of Systems  Constitutional: Negative for chills and fever.  HENT: Negative for congestion.   Eyes: Negative for visual disturbance.  Respiratory: Positive for shortness of breath. Negative for cough.   Cardiovascular: Positive for chest pain and palpitations. Negative for leg swelling.  Gastrointestinal: Negative for abdominal pain, diarrhea,  nausea and vomiting.  Genitourinary: Negative for dysuria and hematuria.  Musculoskeletal: Negative for back pain and neck pain.  Skin: Negative for rash.  Neurological: Positive for light-headedness. Negative for dizziness, weakness, numbness and headaches.  Psychiatric/Behavioral: Negative for confusion.  All other systems reviewed and are negative.    Physical Exam Updated Vital Signs BP 103/71   Pulse 79   Temp 98.8 F (37.1 C) (Oral)   Resp 19   Ht 5\' 3"  (1.6 m)   Wt 65.3 kg (144 lb)   LMP 10/28/2017   SpO2 99%   BMI 25.51 kg/m   Physical Exam  Constitutional: She is oriented to person, place, and time. She appears well-developed and well-nourished.  HENT:  Head: Normocephalic and atraumatic.  Mouth/Throat: Oropharynx is clear and moist and mucous membranes are normal.  Eyes: Pupils are equal, round, and reactive to light. Conjunctivae, EOM and lids are normal.  Neck: Full passive range of motion without pain.  Cardiovascular: Normal rate and regular rhythm. Exam reveals no gallop and no friction rub.  Murmur heard.  Systolic murmur is present. Pulses:      Radial pulses are 2+ on the right side, and 2+ on the left side.       Dorsalis pedis pulses are 2+ on the right side, and 2+ on the left side.  Pulmonary/Chest: Effort normal and breath sounds normal.  Lungs clear to auscultation bilaterally.  Symmetric chest rise.  No wheezing, rales, rhonchi  Abdominal: Soft. Normal appearance. There is no tenderness. There is no rigidity and no guarding.  Musculoskeletal: Normal range of motion.  Bilateral lower extremities are symmetric in appearance without any overlying warmth, erythema, edema.  Neurological: She is alert and oriented to person, place, and time.  Skin: Skin is warm and dry. Capillary refill takes less than 2 seconds.  Psychiatric: She has a normal mood and affect. Her speech is normal.  Nursing note and vitals reviewed.    ED Treatments / Results   Labs (all labs ordered are listed, but only abnormal results are displayed) Labs Reviewed  CBC WITH DIFFERENTIAL/PLATELET - Abnormal; Notable for the following components:      Result Value   MCHC 36.1 (*)    All other components within normal limits  RAPID URINE DRUG SCREEN, HOSP PERFORMED - Abnormal; Notable for the following components:   Barbiturates   (*)    Value: Result not available. Reagent lot number recalled by manufacturer.   All other components within normal limits  BASIC METABOLIC PANEL  TROPONIN I  PREGNANCY, URINE    EKG EKG Interpretation  Date/Time:  Monday November 10 2017 12:56:13 EDT Ventricular Rate:  98 PR Interval:    QRS Duration: 159 QT Interval:  406 QTC Calculation: 519 R Axis:   41 Text Interpretation:  Sinus rhythm Right bundle branch block Abnrm T, consider ischemia, anterolateral lds Baseline wander in lead(s) V4 ST depression in V 2 new since previous, RBBB old  Confirmed by Richardean Canal [phone]) on 11/10/2017 12:59:48 PM Also confirmed by Richardean Canal [phone]), editor Sheppard Evens ([id number])  on 11/10/2017 3:45:37 PM   Radiology Dg Chest 2 View  Result Date: 11/10/2017 CLINICAL DATA:  Heart palpitations. Tetralogy of Fallot with prosthetic pulmonary valve and stent in the pulmonary artery. EXAM: CHEST - 2 VIEW COMPARISON:  CT scan and chest x-ray dated 11/10/2014 FINDINGS: Heart size and pulmonary vascularity are normal and the lungs are clear. No effusions. No bone abnormality. Prosthetic pulmonary valve and stent in the main pulmonary artery are again noted. Slight thoracolumbar scoliosis. This is unchanged. Fusion of the right third and fourth ribs. IMPRESSION: No acute abnormalities. Electronically Signed   By: Francene Boyers M.D.   On: 11/10/2017 14:11    Procedures Procedures (including critical care time)  Medications Ordered in ED Medications  sodium chloride 0.9 % bolus 500 mL (0 mLs Intravenous Stopped 11/10/17 1437)     Initial  Impression / Assessment and Plan / ED Course  I have reviewed the triage vital signs and the nursing notes.  Pertinent labs & imaging results that were available during my care of the patient were reviewed by me and considered in my medical decision making (see chart for details).     27 year old female past medical history of tetralogy of fellow, right bundle branch who presents for evaluation of palpitations that is been ongoing for last week.  Reports they are intermittent.  Not brought on by exertion.  Reports she gets some chest pain shortness of breath with the palpitations.  Currently not on any blockers. Patient is afebrile, non-toxic appearing, sitting comfortably on examination table. Vital signs reviewed and stable.  On exam, systolic murmur heard but this is not new.  Equal pulses all 4 extremities.  Bilateral lower extremities are symmetric in appearance.  Consider arrhythmia versus ACS etiology vs infectious etiology.  History/physical exam not concerning for PE.  Patient has OCPs listed on her medications but she tells me she is not taking them.  Patient has had no syncopal episodes of it may be concerned about outflow obstruction.  CBC without any significant leukocytosis, anemia.  BMP is unremarkable.  Urine pregnancy negative.  UDS is negative for any abnormalities.  Troponin is negative.  Gust with Dr. Antony Haste who independently evaluated the patient.  Patient's vitals have remained stable here in the ED.  Heart rate has remained lower than 100 her entire time.  Patient is not hypoxic.  She has not had any pain while being here in the ED.  Will likely need to be restarted on her metoprolol and follow-up with cardiology.  We will plan to consult with cardiology at [facility] for further evaluation and management.  Discussed patient with Dr. Duard Brady (Cardiology).  We discussed patient's work-up here in the ED.  Agrees with plan for discharge with restarting of metoprolol.  He will range for patient  to be seen at the [facility] cardiology clinic tomorrow at 12 PM.  Request that she bring copies of her lab work and EKG.  Updated patient and mom on plan.  They are agreeable.  Instructed them to go directly to the cardiology clinic tomorrow at 12.  Patient stable for discharge at this time.  Vital signs are stable. At this time, there is no life threatening emergent condition.  Patient instructed to follow-up with cardiology as directed. Patient had ample opportunity for questions and discussion. All patient's questions were answered with full understanding. Strict return precautions  discussed. Patient expresses understanding and agreement to plan.   Final Clinical Impressions(s) / ED Diagnoses   Final diagnoses:  Palpitations    ED Discharge Orders        Ordered    metoprolol succinate (TOPROL-XL) 25 MG 24 hr tablet  2 times daily     11/10/17 1704    ALPRAZolam (XANAX) 0.25 MG tablet  At bedtime PRN     11/10/17 1704       Maxwell Caul, PA-C 11/10/17 2002    Charlynne Pander, MD 11/11/17 [phone]

## 2017-11-10 NOTE — Discharge Instructions (Signed)
Take metoprolol as directed.  She can take the Xanax as needed for symptomatic relief.  As we discussed, please [facility].  It is located on [address]  There is a [employer] next to the clinic.  They are planning to see you at 12 PM tomorrow.  Go to their office.  Bring a copy of your EKG and lab work that was done today.  Return to the Emergency Department immediately if you experiencing worsening chest pain, difficulty breathing, nausea/vomiting, get very sweaty, headache or any other worsening or concerning symptoms.

## 2017-11-10 NOTE — ED Notes (Signed)
Called pt Dr  Kateri Mcuke (cardiologist Deatra JamesKrasuski )  [phone])  will call pa directly  As soon as he is paged

## 2017-12-09 ENCOUNTER — Ambulatory Visit: Payer: Self-pay | Admitting: Nurse Practitioner
